# Patient Record
Sex: Male | Born: 1955
Health system: Southern US, Community
[De-identification: ages and names within clinical notes are randomized; demographics above are authoritative.]

## PROBLEM LIST (undated history)

## (undated) DIAGNOSIS — L989 Disorder of the skin and subcutaneous tissue, unspecified: Secondary | ICD-10-CM

## (undated) DIAGNOSIS — S2239XA Fracture of one rib, unspecified side, initial encounter for closed fracture: Secondary | ICD-10-CM

## (undated) DIAGNOSIS — J449 Chronic obstructive pulmonary disease, unspecified: Secondary | ICD-10-CM

## (undated) DIAGNOSIS — R0781 Pleurodynia: Secondary | ICD-10-CM

## (undated) DIAGNOSIS — J302 Other seasonal allergic rhinitis: Secondary | ICD-10-CM

## (undated) DIAGNOSIS — H6690 Otitis media, unspecified, unspecified ear: Secondary | ICD-10-CM

## (undated) DIAGNOSIS — J45909 Unspecified asthma, uncomplicated: Secondary | ICD-10-CM

## (undated) HISTORY — DX: Other seasonal allergic rhinitis: J30.2

## (undated) HISTORY — DX: Disorder of the skin and subcutaneous tissue, unspecified: L98.9

## (undated) HISTORY — DX: Pleurodynia: R07.81

## (undated) HISTORY — PX: HERNIA REPAIR: SHX51

## (undated) HISTORY — DX: Fracture of one rib, unspecified side, initial encounter for closed fracture: S22.39XA

## (undated) HISTORY — DX: Otitis media, unspecified, unspecified ear: H66.90

---

## 1998-02-23 HISTORY — PX: OTHER SURGICAL HISTORY: SHX169

## 2014-12-03 DIAGNOSIS — J449 Chronic obstructive pulmonary disease, unspecified: Secondary | ICD-10-CM | POA: Insufficient documentation

## 2014-12-03 DIAGNOSIS — F17210 Nicotine dependence, cigarettes, uncomplicated: Secondary | ICD-10-CM | POA: Insufficient documentation

## 2015-01-27 ENCOUNTER — Emergency Department (HOSPITAL_BASED_OUTPATIENT_CLINIC_OR_DEPARTMENT_OTHER): Payer: 59

## 2015-01-27 ENCOUNTER — Encounter (HOSPITAL_BASED_OUTPATIENT_CLINIC_OR_DEPARTMENT_OTHER): Payer: Self-pay | Admitting: Emergency Medicine

## 2015-01-27 ENCOUNTER — Emergency Department (HOSPITAL_BASED_OUTPATIENT_CLINIC_OR_DEPARTMENT_OTHER)
Admission: EM | Admit: 2015-01-27 | Discharge: 2015-01-28 | Disposition: A | Discharge status: Home / Self Care | Payer: 59 | Attending: Emergency Medicine | Admitting: Emergency Medicine

## 2015-01-27 DIAGNOSIS — J45909 Unspecified asthma, uncomplicated: Secondary | ICD-10-CM | POA: Diagnosis not present

## 2015-01-27 DIAGNOSIS — K858 Other acute pancreatitis without necrosis or infection: Secondary | ICD-10-CM

## 2015-01-27 DIAGNOSIS — Z9889 Other specified postprocedural states: Secondary | ICD-10-CM | POA: Diagnosis not present

## 2015-01-27 DIAGNOSIS — F17200 Nicotine dependence, unspecified, uncomplicated: Secondary | ICD-10-CM | POA: Insufficient documentation

## 2015-01-27 DIAGNOSIS — R1084 Generalized abdominal pain: Secondary | ICD-10-CM | POA: Diagnosis present

## 2015-01-27 HISTORY — DX: Unspecified asthma, uncomplicated: J45.909

## 2015-01-27 LAB — URINALYSIS, ROUTINE W REFLEX MICROSCOPIC
Bilirubin Urine: NEGATIVE
Glucose, UA: NEGATIVE mg/dL
HGB URINE DIPSTICK: NEGATIVE
KETONES UR: NEGATIVE mg/dL
LEUKOCYTES UA: NEGATIVE
Nitrite: NEGATIVE
PROTEIN: NEGATIVE mg/dL
Specific Gravity, Urine: 1.02 (ref 1.005–1.030)
pH: 6.5 (ref 5.0–8.0)

## 2015-01-27 LAB — COMPREHENSIVE METABOLIC PANEL
ALBUMIN: 4 g/dL (ref 3.5–5.0)
ALT: 24 U/L (ref 17–63)
AST: 33 U/L (ref 15–41)
Alkaline Phosphatase: 72 U/L (ref 38–126)
Anion gap: 9 (ref 5–15)
BUN: 10 mg/dL (ref 6–20)
CHLORIDE: 99 mmol/L — AB (ref 101–111)
CO2: 28 mmol/L (ref 22–32)
CREATININE: 0.71 mg/dL (ref 0.61–1.24)
Calcium: 9.1 mg/dL (ref 8.9–10.3)
GFR calc Af Amer: >60 mL/min (ref >60)
GFR calc non Af Amer: >60 mL/min (ref >60)
GLUCOSE: 135 mg/dL — AB (ref 65–99)
POTASSIUM: 3.8 mmol/L (ref 3.5–5.1)
SODIUM: 136 mmol/L (ref 135–145)
Total Bilirubin: 1.2 mg/dL (ref 0.3–1.2)
Total Protein: 6.6 g/dL (ref 6.5–8.1)

## 2015-01-27 LAB — CBC WITH DIFFERENTIAL/PLATELET
BASOS ABS: 0 10*3/uL (ref 0.0–0.1)
BASOS PCT: 0 %
EOS ABS: 0 10*3/uL (ref 0.0–0.7)
EOS PCT: 1 %
HCT: 48.5 % (ref 39.0–52.0)
Hemoglobin: 16.8 g/dL (ref 13.0–17.0)
LYMPHS PCT: 9 %
Lymphs Abs: 0.7 10*3/uL (ref 0.7–4.0)
MCH: 32.7 pg (ref 26.0–34.0)
MCHC: 34.6 g/dL (ref 30.0–36.0)
MCV: 94.5 fL (ref 78.0–100.0)
MONO ABS: 0.8 10*3/uL (ref 0.1–1.0)
Monocytes Relative: 9 %
Neutro Abs: 6.8 10*3/uL (ref 1.7–7.7)
Neutrophils Relative %: 81 %
PLATELETS: 198 10*3/uL (ref 150–400)
RBC: 5.13 MIL/uL (ref 4.22–5.81)
RDW: 12.9 % (ref 11.5–15.5)
WBC: 8.4 10*3/uL (ref 4.0–10.5)

## 2015-01-27 LAB — LIPASE, BLOOD: LIPASE: 388 U/L — AB (ref 11–51)

## 2015-01-27 MED ORDER — SUCRALFATE 1 G PO TABS
1.0000 g | ORAL_TABLET | Freq: Once | ORAL | Status: AC
  Administered 2015-01-27: 1 g via ORAL
  Filled 2015-01-27: qty 1

## 2015-01-27 MED ORDER — MORPHINE SULFATE (PF) 4 MG/ML IV SOLN
4.0000 mg | Freq: Once | INTRAVENOUS | Status: AC
  Administered 2015-01-27: 4 mg via INTRAVENOUS
  Filled 2015-01-27: qty 1

## 2015-01-27 MED ORDER — SODIUM CHLORIDE 0.9 % IV BOLUS (SEPSIS)
1000.0000 mL | Freq: Once | INTRAVENOUS | Status: AC
  Administered 2015-01-27: 1000 mL via INTRAVENOUS

## 2015-01-27 MED ORDER — HYDROMORPHONE HCL 1 MG/ML IJ SOLN
1.0000 mg | Freq: Once | INTRAMUSCULAR | Status: AC
  Administered 2015-01-27: 1 mg via INTRAVENOUS
  Filled 2015-01-27: qty 1

## 2015-01-27 MED ORDER — PANTOPRAZOLE SODIUM 40 MG IV SOLR
40.0000 mg | Freq: Once | INTRAVENOUS | Status: AC
  Administered 2015-01-27: 40 mg via INTRAVENOUS
  Filled 2015-01-27: qty 40

## 2015-01-27 MED ORDER — GI COCKTAIL ~~LOC~~
30.0000 mL | Freq: Once | ORAL | Status: AC
  Administered 2015-01-27: 30 mL via ORAL
  Filled 2015-01-27: qty 30

## 2015-01-27 MED ORDER — SODIUM CHLORIDE 0.9 % IV BOLUS (SEPSIS)
1000.0000 mL | Freq: Once | INTRAVENOUS | Status: AC
Start: 2015-01-27 — End: 2015-01-28
  Administered 2015-01-27: 1000 mL via INTRAVENOUS

## 2015-01-27 MED ORDER — ONDANSETRON HCL 4 MG/2ML IJ SOLN
4.0000 mg | Freq: Once | INTRAMUSCULAR | Status: AC
  Administered 2015-01-27: 4 mg via INTRAVENOUS
  Filled 2015-01-27: qty 2

## 2015-01-27 NOTE — ED Notes (Addendum)
Patient states that he ate quite a few oysters and drank some beers and now his Stomach is on "fire". Patient is guarding his stomach

## 2015-01-27 NOTE — ED Notes (Signed)
Pt states he drove to the ED but he will UBER home.

## 2015-01-27 NOTE — ED Provider Notes (Signed)
CSN: [id number]     Arrival date & time 01/27/15  2210 History  By signing my name below, I, Lucas Maldonado, attest that this documentation has been prepared under the direction and in the presence of Shon Baton, MD. Electronically Signed: Octavia Heir, ED Scribe. 01/27/2015. 11:34 PM.     Chief Complaint  Patient presents with  . Abdominal Pain      The history is provided by the patient. No language interpreter was used.   HPI Comments: [name] is a 59 y.o. male who presents to the Emergency Department complaining of constant, severe, gradual worsening, "10/10", generalized abdominal pain onset last night. He describes the pain as burning and sharp. The pain does not radiate. Pt reports eating a numerous amount of oysters and drank a few beer Saturday and notes his stomach is "on fire". He states he has not taken any medication for the pain but reports drinking plenty of water. Pt denies vomiting, diarrhea, fever, and sick contacts.  Past Medical History  Diagnosis Date  . Asthma    Past Surgical History  Procedure Laterality Date  . Hernia repair     History reviewed. No pertinent family history. Social History  Substance Use Topics  . Smoking status: Current Every Day Smoker  . Smokeless tobacco: None  . Alcohol Use: Yes     Comment: occ    Review of Systems  Constitutional: Negative for fever.  Respiratory: Negative for chest tightness and shortness of breath.   Cardiovascular: Negative for chest pain.  Gastrointestinal: Positive for nausea and abdominal pain. Negative for vomiting and diarrhea.  All other systems reviewed and are negative.     Allergies  Review of patient's allergies indicates no known allergies.  Home Medications   Prior to Admission medications   Medication Sig Start Date End Date Taking? Authorizing Provider  ondansetron (ZOFRAN ODT) 4 MG disintegrating tablet Take 1 tablet (4 mg total) by mouth every 8 (eight) hours as  needed for nausea or vomiting. 01/28/15   Shon Baton, MD  oxyCODONE-acetaminophen (PERCOCET/ROXICET) 5-325 MG tablet Take 1-2 tablets by mouth every 4 (four) hours as needed for severe pain. 01/28/15   Shon Baton, MD   Triage vitals: BP 170/95 mmHg  Pulse 76  Temp(Src) 98.3 F (36.8 C) (Oral)  Resp 22  Ht  (1.93 m)  Wt 190 lb (86.183 kg)  BMI 23.14 kg/m2  SpO2 95% Physical Exam  Constitutional: He is oriented to person, place, and time. He appears well-developed and well-nourished. No distress.  HENT:  Head: Normocephalic and atraumatic.  Cardiovascular: Normal rate, regular rhythm and normal heart sounds.   No murmur heard. Pulmonary/Chest: Effort normal and breath sounds normal. No respiratory distress. He has no wheezes.  Abdominal: Soft. Bowel sounds are normal. There is tenderness. There is no rebound and no guarding.  Mild epigastric tenderness to palpation without rebound or guarding  Musculoskeletal: He exhibits no edema.  Neurological: He is alert and oriented to person, place, and time.  Skin: Skin is warm and dry.  Psychiatric: He has a normal mood and affect.  Nursing note and vitals reviewed.   ED Course  Procedures  DIAGNOSTIC STUDIES: Oxygen Saturation is 95% on RA, adequate by my interpretation.  COORDINATION OF CARE:  11:33 PM Discussed treatment plan with pt at bedside and pt agreed to plan.  Labs Review Labs Reviewed  URINALYSIS, ROUTINE W REFLEX MICROSCOPIC (NOT AT [facility]) - Abnormal; Notable for the following:  Color, Urine AMBER (*)    All other components within normal limits  COMPREHENSIVE METABOLIC PANEL - Abnormal; Notable for the following:    Chloride 99 (*)    Glucose, Bld 135 (*)    All other components within normal limits  LIPASE, BLOOD - Abnormal; Notable for the following:    Lipase 388 (*)    All other components within normal limits  CBC WITH DIFFERENTIAL/PLATELET    Imaging Review Dg Abd Acute  W/chest  01/27/2015  CLINICAL DATA:  Acute onset of postprandial gastric discomfort. Initial encounter. EXAM: DG ABDOMEN ACUTE W/ 1V CHEST COMPARISON:  None. FINDINGS: The lungs are well-aerated and clear. There is no evidence of focal opacification, pleural effusion or pneumothorax. The cardiomediastinal silhouette is within normal limits. The visualized bowel gas pattern is unremarkable. Scattered fluid and air are seen within the small and large bowel, suggestive of mild dysmotility; there is no evidence of small bowel dilatation to suggest obstruction. No free intra-abdominal air is identified on the provided upright view. No acute osseous abnormalities are seen; the sacroiliac joints are unremarkable in appearance. There is mild developmental deformity involving the femoral necks bilaterally. IMPRESSION: 1. Scattered fluid and air noted within the visualized bowel, suggestive of mild dysmotility. No free intra-abdominal air seen. 2. No acute cardiopulmonary process seen. Electronically Signed   By: Roanna RaiderJeffery  Chang M.D.   On: 01/27/2015 23:47   I have personally reviewed and evaluated these images and lab results as part of my medical decision-making.   EKG Interpretation None      MDM   Final diagnoses:  Other acute pancreatitis   Patient presents with epigastric pain. Onset last night. Associated with increased alcohol intake.  Minimally tender on exam. Nontoxic and vital signs are reassuring. Patient was previously medically screened by my colleague and given pain medication. Still rating pain at 10 out of 10. Lab work is notable for a lipase of 388. Patient likely has mild pancreatitis associated with increased alcohol use.  Patient feels better after pain medication and fluids. He would like to try to go home. Given that his lipase is minimally elevated and he is comfortable, field is reasonable for him to go home. Discussed with patient limiting by mouth intake and focusing on hydration.  He'll be given pain medications. He was given strict return precautions.  After history, exam, and medical workup I feel the patient has been appropriately medically screened and is safe for discharge home. Pertinent diagnoses were discussed with the patient. Patient was given return precautions.  I personally performed the services described in this documentation, which was scribed in my presence. The recorded information has been reviewed and is accurate.   Shon Batonourtney F Jamieson Hetland, MD 01/28/15 [phone]

## 2015-01-28 MED ORDER — ONDANSETRON 4 MG PO TBDP: 4.0000 mg | ORAL_TABLET | Freq: Three times a day (TID) | ORAL | Status: DC | PRN

## 2015-01-28 MED ORDER — OXYCODONE-ACETAMINOPHEN 5-325 MG PO TABS
1.0000 | ORAL_TABLET | Freq: Once | ORAL | Status: AC
  Administered 2015-01-28: 1 via ORAL
  Filled 2015-01-28: qty 1

## 2015-01-28 MED ORDER — OXYCODONE-ACETAMINOPHEN 5-325 MG PO TABS: 1.0000 | ORAL_TABLET | ORAL | Status: DC | PRN

## 2015-01-28 NOTE — Discharge Instructions (Signed)
You were seen today and had evidence of pancreatitis. The treatment is pain control, hydration, and decreased by mouth intake for several days. If you develop worsening pain, fevers, difficulty tolerating fluids at home you need to be reevaluated immediately.  Acute Pancreatitis Acute pancreatitis is a disease in which the pancreas becomes suddenly inflamed. The pancreas is a large gland located behind your stomach. The pancreas produces enzymes that help digest food. The pancreas also releases the hormones glucagon and insulin that help regulate blood sugar. Damage to the pancreas occurs when the digestive enzymes from the pancreas are activated and begin attacking the pancreas before being released into the intestine. Most acute attacks last a couple of days and can cause serious complications. Some people become dehydrated and develop low blood pressure. In severe cases, bleeding into the pancreas can lead to shock and can be life-threatening. The lungs, heart, and kidneys may fail. CAUSES  Pancreatitis can happen to anyone. In some cases, the cause is unknown. Most cases are caused by:  Alcohol abuse.  Gallstones. Other less common causes are:  Certain medicines.  Exposure to certain chemicals.  Infection.  Damage caused by an accident (trauma).  Abdominal surgery. SYMPTOMS   Pain in the upper abdomen that may radiate to the back.  Tenderness and swelling of the abdomen.  Nausea and vomiting. DIAGNOSIS  Your caregiver will perform a physical exam. Blood and stool tests may be done to confirm the diagnosis. Imaging tests may also be done, such as X-rays, CT scans, or an ultrasound of the abdomen. TREATMENT  Treatment usually requires a stay in the hospital. Treatment may include:  Pain medicine.  Fluid replacement through an intravenous line (IV).  Placing a tube in the stomach to remove stomach contents and control vomiting.  Not eating for 3 or 4 days. This gives your  pancreas a rest, because enzymes are not being produced that can cause further damage.  Antibiotic medicines if your condition is caused by an infection.  Surgery of the pancreas or gallbladder. HOME CARE INSTRUCTIONS   Follow the diet advised by your caregiver. This may involve avoiding alcohol and decreasing the amount of fat in your diet.  Eat smaller, more frequent meals. This reduces the amount of digestive juices the pancreas produces.  Drink enough fluids to keep your urine clear or pale yellow.  Only take over-the-counter or prescription medicines as directed by your caregiver.  Avoid drinking alcohol if it caused your condition.  Do not smoke.  Get plenty of rest.  Check your blood sugar at home as directed by your caregiver.  Keep all follow-up appointments as directed by your caregiver. SEEK MEDICAL CARE IF:   You do not recover as quickly as expected.  You develop new or worsening symptoms.  You have persistent pain, weakness, or nausea.  You recover and then have another episode of pain. SEEK IMMEDIATE MEDICAL CARE IF:   You are unable to eat or keep fluids down.  Your pain becomes severe.  You have a fever or persistent symptoms for more than 2 to 3 days.  You have a fever and your symptoms suddenly get worse.  Your skin or the white part of your eyes turn yellow (jaundice).  You develop vomiting.  You feel dizzy, or you faint.  Your blood sugar is high (over 300 mg/dL). MAKE SURE YOU:   Understand these instructions.  Will watch your condition.  Will get help right away if you are not doing well or  get worse.   This information is not intended to replace advice given to you by your health care provider. Make sure you discuss any questions you have with your health care provider.   Document Released: 02/09/2005 Document Revised: 08/11/2011 Document Reviewed: 05/21/2011 Elsevier Interactive Patient Education [employer]

## 2015-04-26 ENCOUNTER — Encounter (HOSPITAL_BASED_OUTPATIENT_CLINIC_OR_DEPARTMENT_OTHER): Payer: Self-pay | Admitting: Emergency Medicine

## 2015-04-26 ENCOUNTER — Emergency Department (HOSPITAL_BASED_OUTPATIENT_CLINIC_OR_DEPARTMENT_OTHER): Payer: 59

## 2015-04-26 ENCOUNTER — Observation Stay (HOSPITAL_BASED_OUTPATIENT_CLINIC_OR_DEPARTMENT_OTHER)
Admission: EM | Admit: 2015-04-26 | Discharge: 2015-04-26 | Disposition: A | Discharge status: Home / Self Care | Payer: 59 | Attending: Cardiology | Admitting: Cardiology

## 2015-04-26 DIAGNOSIS — J449 Chronic obstructive pulmonary disease, unspecified: Secondary | ICD-10-CM | POA: Insufficient documentation

## 2015-04-26 DIAGNOSIS — J45909 Unspecified asthma, uncomplicated: Secondary | ICD-10-CM | POA: Insufficient documentation

## 2015-04-26 DIAGNOSIS — R9431 Abnormal electrocardiogram [ECG] [EKG]: Secondary | ICD-10-CM | POA: Insufficient documentation

## 2015-04-26 DIAGNOSIS — F17210 Nicotine dependence, cigarettes, uncomplicated: Secondary | ICD-10-CM | POA: Diagnosis not present

## 2015-04-26 DIAGNOSIS — I200 Unstable angina: Secondary | ICD-10-CM | POA: Diagnosis not present

## 2015-04-26 DIAGNOSIS — R079 Chest pain, unspecified: Principal | ICD-10-CM | POA: Insufficient documentation

## 2015-04-26 HISTORY — DX: Chronic obstructive pulmonary disease, unspecified: J44.9

## 2015-04-26 LAB — TROPONIN I: Troponin I: <0.03 ng/mL (ref <0.031)

## 2015-04-26 LAB — COMPREHENSIVE METABOLIC PANEL
ALBUMIN: 4.3 g/dL (ref 3.5–5.0)
ALK PHOS: 59 U/L (ref 38–126)
ALT: 19 U/L (ref 17–63)
AST: 23 U/L (ref 15–41)
Anion gap: 10 (ref 5–15)
BUN: 10 mg/dL (ref 6–20)
CALCIUM: 9.2 mg/dL (ref 8.9–10.3)
CHLORIDE: 104 mmol/L (ref 101–111)
CO2: 26 mmol/L (ref 22–32)
CREATININE: 0.69 mg/dL (ref 0.61–1.24)
GFR calc Af Amer: >60 mL/min (ref >60)
GFR calc non Af Amer: >60 mL/min (ref >60)
GLUCOSE: 113 mg/dL — AB (ref 65–99)
Potassium: 4.4 mmol/L (ref 3.5–5.1)
SODIUM: 140 mmol/L (ref 135–145)
Total Bilirubin: 0.5 mg/dL (ref 0.3–1.2)
Total Protein: 7.1 g/dL (ref 6.5–8.1)

## 2015-04-26 LAB — PROTIME-INR
INR: 0.97 (ref 0.00–1.49)
Prothrombin Time: 13.1 seconds (ref 11.6–15.2)

## 2015-04-26 LAB — LIPID PANEL
CHOL/HDL RATIO: 3.1 ratio
Cholesterol: 228 mg/dL — ABNORMAL HIGH (ref 0–200)
HDL: 73 mg/dL (ref >40)
LDL Cholesterol: 137 mg/dL — ABNORMAL HIGH (ref 0–99)
Triglycerides: 89 mg/dL (ref <150)
VLDL: 18 mg/dL (ref 0–40)

## 2015-04-26 LAB — CBC WITH DIFFERENTIAL/PLATELET
BASOS ABS: 0 10*3/uL (ref 0.0–0.1)
BASOS PCT: 1 %
EOS ABS: 0.5 10*3/uL (ref 0.0–0.7)
Eosinophils Relative: 6 %
HEMATOCRIT: 46.2 % (ref 39.0–52.0)
HEMOGLOBIN: 16 g/dL (ref 13.0–17.0)
Lymphocytes Relative: 22 %
Lymphs Abs: 1.6 10*3/uL (ref 0.7–4.0)
MCH: 33.2 pg (ref 26.0–34.0)
MCHC: 34.6 g/dL (ref 30.0–36.0)
MCV: 95.9 fL (ref 78.0–100.0)
MONOS PCT: 12 %
Monocytes Absolute: 0.9 10*3/uL (ref 0.1–1.0)
NEUTROS ABS: 4.3 10*3/uL (ref 1.7–7.7)
NEUTROS PCT: 59 %
Platelets: 261 10*3/uL (ref 150–400)
RBC: 4.82 MIL/uL (ref 4.22–5.81)
RDW: 14 % (ref 11.5–15.5)
WBC: 7.2 10*3/uL (ref 4.0–10.5)

## 2015-04-26 LAB — APTT: APTT: 30 s (ref 24–37)

## 2015-04-26 LAB — LIPASE, BLOOD: Lipase: 30 U/L (ref 11–51)

## 2015-04-26 MED ORDER — ASPIRIN 81 MG PO CHEW: 81.0000 mg | CHEWABLE_TABLET | Freq: Every day | ORAL | Status: DC

## 2015-04-26 MED ORDER — AZITHROMYCIN 250 MG PO TABS
500.0000 mg | ORAL_TABLET | Freq: Once | ORAL | Status: AC
  Administered 2015-04-26: 500 mg via ORAL
  Filled 2015-04-26: qty 2

## 2015-04-26 MED ORDER — NITROGLYCERIN IN D5W 200-5 MCG/ML-% IV SOLN
5.0000 ug/min | Freq: Once | INTRAVENOUS | Status: AC
  Administered 2015-04-26: 5 ug/min via INTRAVENOUS
  Filled 2015-04-26: qty 250

## 2015-04-26 MED ORDER — ACETAMINOPHEN 325 MG PO TABS
650.0000 mg | ORAL_TABLET | Freq: Once | ORAL | Status: AC
  Administered 2015-04-26: 650 mg via ORAL
  Filled 2015-04-26: qty 2

## 2015-04-26 MED ORDER — IPRATROPIUM-ALBUTEROL 0.5-2.5 (3) MG/3ML IN SOLN
3.0000 mL | Freq: Once | RESPIRATORY_TRACT | Status: AC
  Administered 2015-04-26: 3 mL via RESPIRATORY_TRACT
  Filled 2015-04-26: qty 3

## 2015-04-26 MED ORDER — ASPIRIN 81 MG PO CHEW
324.0000 mg | CHEWABLE_TABLET | Freq: Once | ORAL | Status: AC
  Administered 2015-04-26: 324 mg via ORAL
  Filled 2015-04-26: qty 4

## 2015-04-26 MED ORDER — IBUPROFEN 800 MG PO TABS
800.0000 mg | ORAL_TABLET | Freq: Once | ORAL | Status: AC
  Administered 2015-04-26: 800 mg via ORAL
  Filled 2015-04-26: qty 1

## 2015-04-26 MED ORDER — HEPARIN (PORCINE) IN NACL 100-0.45 UNIT/ML-% IJ SOLN
12.0000 [IU]/kg/h | INTRAMUSCULAR | Status: DC
  Administered 2015-04-26: 12 [IU]/kg/h via INTRAVENOUS
  Filled 2015-04-26: qty 250

## 2015-04-26 MED ORDER — AZITHROMYCIN 250 MG PO TABS: ORAL_TABLET | ORAL | Status: DC

## 2015-04-26 MED ORDER — ATORVASTATIN CALCIUM 20 MG PO TABS
20.0000 mg | ORAL_TABLET | Freq: Every day | ORAL | Status: DC
  Filled 2015-04-26: qty 1

## 2015-04-26 MED FILL — AZITHROMYCIN 250 MG TABLET: 250 | 4 days supply | Qty: 4 | Fill #0

## 2015-04-26 NOTE — ED Notes (Signed)
Pt reports left chest pressure non radiating with mild SOB onset this AM. Also concerned that it is related to skin infection he is being [facility] with keflex

## 2015-04-26 NOTE — ED Notes (Signed)
2nd EKG is the same as first EKG, pt's chest pain remains a 2/10 at this time. Pt came in originally for evaluation of ear pain with vein pain and rash to feet.  He noticed some left side chest pressure last night with SOB that was different than his typical COPD symptoms.

## 2015-04-26 NOTE — ED Notes (Addendum)
Pt placed on auto vitals Q30. Patient placed on cardiac monitor.  

## 2015-04-26 NOTE — ED Provider Notes (Signed)
10:31 AM Dr. Jacinto HalimGanji called this provider. Patient was initially admitted to the hospital by the overnight provider. Patient had chest pain on his left arm. Patient's EKG showed diffuse ST elevations. Repeat EKGs have been unchanged. We cycled 2 troponins which were negative. Dr. Jacinto HalimGanji initially thought that this was ischemic. However after talking to the patient on the phone, getting 2 negative troponins, negative chest x-ray, and EKGs that have not changed, Dr. Jacinto HalimGanji does not think that he needs a cardiac cath at this time. He will see patient in his office on Monday morning.  I discussed this plan with patient. We will get a second troponin at 1 PM. Patient still has "chest pain". This pain has not changed at all in the course of his stay here. In fact she states that this pain has been going on for a long period of time. It is not positional. It is not exertional. Not associated with diaphoresis. In talking to the patient he is not really concerned about his chest pain, he is most concerned that he "has an infection all over his body". Patient reports that since he moved to the area a year ago he's had cough congestion boils and rash. He is concerned that there is some infection.  Patient pain does not appear cardiac at this time. His EKGs although initially concerning has not had any changes. Dr. Jacinto HalimGanji thinks this is BER.  Patient has no history of hypertension or hyperlipidemia. His only risk factor is smoking.  Given that the expert, cardiologist, does not think that he will do any provocative testing there is little utility in admission to hospital at this time. We will repeat a third troponin. If continues to be normal this will be sufficient as an observation of patient and plan will be to discharge home with follow-up on Monday.  2:25 PM Troponin negative. Have observed patient for 9 hours, multiple cycled troponins, all negative with no ekg changes. Has been reviewed by cardiology and they would  like follow up on Monday or Tuesday. Pt feels comfortable with plan. Normal vitals, physical exam at time of discharge.   Faylinn Schwenn Randall AnLyn Kayal Mula, MD 04/26/15 1426

## 2015-04-26 NOTE — Discharge Instructions (Signed)
Your labs indicate that your chest pain is not due to your heart. We want you to follow up with the cardiologist within 48 hours- on Monday.  Dr. Jacinto Halim will see you.  In addition please take these antibiotics for bronchitis for your cough, we think it may help you feel better.   Please return with increased chest pain, sweating, shortness of breath or other concerns.   We want you to establish care with a PCP as well. We have given you phone number help you find one.   Acute Coronary Syndrome Acute coronary syndrome (ACS) is a serious problem in which there is suddenly not enough blood and oxygen supplied to the heart. ACS may mean that one or more of the blood vessels in your heart (coronary arteries) may be blocked. ACS can result in chest pain or a heart attack (myocardial infarction or MI). CAUSES This condition is caused by atherosclerosis, which is the buildup of fat and cholesterol (plaque) on the inside of the arteries. Over time, the plaque may narrow or block the artery, and this will lessen blood flow to the heart. Plaque can also become weak and break off within a coronary artery to form a clot and cause a sudden blockage. RISK FACTORS The risks factors of this condition include:  High cholesterol levels.  High blood pressure (hypertension).  Smoking.  Diabetes.  Age.  Family history of chest pain, heart disease, or stroke.  Lack of exercise. SYMPTOMS The most common signs of this condition include:  Chest pain, which can be:  A crushing or squeezing in the chest.  A tightness, pressure, fullness, or heaviness in the chest.  Present for more than a few minutes, or it can stop and recur.  Pain in the arms, neck, jaw, or back.  Unexplained heartburn or indigestion.  Shortness of breath.  Nausea.  Sudden cold sweats.  Feeling light-headed or dizzy. Sometimes, this condition has no symptoms. DIAGNOSIS ACS may be diagnosed through the following  tests:  Electrocardiogram (ECG).  Blood tests.  Coronary angiogram. This is a procedure to look at the coronary arteries to see if there is any blockage. TREATMENT Treatment for ACS may include:  Healthy behavioral changes to reduce or control risk factors.  Medicine.  Coronary stenting.A stent helps to keep an artery open.  Coronary angioplasty. This procedure widens a narrowed or blocked artery.  Coronary artery bypass surgery. This will allow your blood to pass the blockage (bypass) to reach your heart. HOME CARE INSTRUCTIONS Eating and Drinking  Follow a heart-healthy diet. A dietitian can you help to educate you about healthy food options and changes.  Use healthy cooking methods such as roasting, grilling, broiling, baking, poaching, steaming, or stir-frying. Talk to a dietitian to learn more about healthy cooking methods. Medicines  Take medicines only as directed by your health care provider.  Do not take the following medicines unless your health care provider approves:  Nonsteroidal anti-inflammatory drugs (NSAIDs), such as ibuprofen, naproxen, or celecoxib.  Vitamin supplements that contain vitamin A, vitamin E, or both.  Hormone replacement therapy that contains estrogen with or without progestin.  Stop illegal drug use. Activities  Follow an exercise program that is approved by your health care provider.  Plan rest periods when you are fatigued. Lifestyle  Do not use any tobacco products, including cigarettes, chewing tobacco, or electronic cigarettes. If you need help quitting, ask your health care provider.  If you drink alcohol, and your health care provider approves, limit  your alcohol intake to no more than 1 drink per day. One drink equals 12 ounces of beer, 5 ounces of wine, or 1 ounces of hard liquor.  Learn to manage stress.  Maintain a healthy weight. Lose weight as approved by your health care provider. General Instructions  Manage other  health conditions, such as hypertension and diabetes, as directed by your health care provider.  Keep all follow-up visits as directed by your health care provider. This is important.  Your health care provider may ask you to monitor your blood pressure. A blood pressure reading consists of a higher number over a lower number, such as 110 over 72, written as 110/72. Ideally, your blood pressure should be:  Below 140/90 if you have no other medical conditions.  Below 130/80 if you have diabetes or kidney disease. SEEK IMMEDIATE MEDICAL CARE IF:  You have pain in your chest, neck, arm, jaw, stomach, or back that lasts more than a few minutes, is recurring, or is not relieved by taking medicine under your tongue (sublingual nitroglycerin).  You have profuse sweating without cause.  You have unexplained:  Heartburn or indigestion.  Shortness of breath or difficulty breathing.  Nausea or vomiting.  Fatigue.  Feelings of nervousness or anxiety.  Weakness.  Diarrhea.  You have sudden light-headedness or dizziness.  You faint. These symptoms may represent a serious problem that is an emergency. Do not wait to see if the symptoms will go away. Get medical help right away. Call your local emergency services (911 in the [place] Do not drive yourself to the clinic or hospital.   This information is not intended to replace advice given to you by your health care provider. Make sure you discuss any questions you have with your health care provider.   Document Released: 02/09/2005 Document Revised: 03/02/2014 Document Reviewed: 06/13/2013 Elsevier Interactive Patient Education 2016 [employer]  Acute Coronary Syndrome Acute coronary syndrome (ACS) is a serious problem in which there is suddenly not enough blood and oxygen supplied to the heart. ACS may mean that one or more of the blood vessels in your heart (coronary arteries) may be blocked. ACS can result in chest pain or a heart  attack (myocardial infarction or MI). CAUSES This condition is caused by atherosclerosis, which is the buildup of fat and cholesterol (plaque) on the inside of the arteries. Over time, the plaque may narrow or block the artery, and this will lessen blood flow to the heart. Plaque can also become weak and break off within a coronary artery to form a clot and cause a sudden blockage. RISK FACTORS The risks factors of this condition include:  High cholesterol levels.  High blood pressure (hypertension).  Smoking.  Diabetes.  Age.  Family history of chest pain, heart disease, or stroke.  Lack of exercise. SYMPTOMS The most common signs of this condition include:  Chest pain, which can be:  A crushing or squeezing in the chest.  A tightness, pressure, fullness, or heaviness in the chest.  Present for more than a few minutes, or it can stop and recur.  Pain in the arms, neck, jaw, or back.  Unexplained heartburn or indigestion.  Shortness of breath.  Nausea.  Sudden cold sweats.  Feeling light-headed or dizzy. Sometimes, this condition has no symptoms. DIAGNOSIS ACS may be diagnosed through the following tests:  Electrocardiogram (ECG).  Blood tests.  Coronary angiogram. This is a procedure to look at the coronary arteries to see if there is any blockage.  TREATMENT Treatment for ACS may include:  Healthy behavioral changes to reduce or control risk factors.  Medicine.  Coronary stenting.A stent helps to keep an artery open.  Coronary angioplasty. This procedure widens a narrowed or blocked artery.  Coronary artery bypass surgery. This will allow your blood to pass the blockage (bypass) to reach your heart. HOME CARE INSTRUCTIONS Eating and Drinking  Follow a heart-healthy diet. A dietitian can you help to educate you about healthy food options and changes.  Use healthy cooking methods such as roasting, grilling, broiling, baking, poaching, steaming, or  stir-frying. Talk to a dietitian to learn more about healthy cooking methods. Medicines  Take medicines only as directed by your health care provider.  Do not take the following medicines unless your health care provider approves:  Nonsteroidal anti-inflammatory drugs (NSAIDs), such as ibuprofen, naproxen, or celecoxib.  Vitamin supplements that contain vitamin A, vitamin E, or both.  Hormone replacement therapy that contains estrogen with or without progestin.  Stop illegal drug use. Activities  Follow an exercise program that is approved by your health care provider.  Plan rest periods when you are fatigued. Lifestyle  Do not use any tobacco products, including cigarettes, chewing tobacco, or electronic cigarettes. If you need help quitting, ask your health care provider.  If you drink alcohol, and your health care provider approves, limit your alcohol intake to no more than 1 drink per day. One drink equals 12 ounces of beer, 5 ounces of wine, or 1 ounces of hard liquor.  Learn to manage stress.  Maintain a healthy weight. Lose weight as approved by your health care provider. General Instructions  Manage other health conditions, such as hypertension and diabetes, as directed by your health care provider.  Keep all follow-up visits as directed by your health care provider. This is important.  Your health care provider may ask you to monitor your blood pressure. A blood pressure reading consists of a higher number over a lower number, such as 110 over 72, written as 110/72. Ideally, your blood pressure should be:  Below 140/90 if you have no other medical conditions.  Below 130/80 if you have diabetes or kidney disease. SEEK IMMEDIATE MEDICAL CARE IF:  You have pain in your chest, neck, arm, jaw, stomach, or back that lasts more than a few minutes, is recurring, or is not relieved by taking medicine under your tongue (sublingual nitroglycerin).  You have profuse sweating  without cause.  You have unexplained:  Heartburn or indigestion.  Shortness of breath or difficulty breathing.  Nausea or vomiting.  Fatigue.  Feelings of nervousness or anxiety.  Weakness.  Diarrhea.  You have sudden light-headedness or dizziness.  You faint. These symptoms may represent a serious problem that is an emergency. Do not wait to see if the symptoms will go away. Get medical help right away. Call your local emergency services (911 in the [place] Do not drive yourself to the clinic or hospital.   This information is not intended to replace advice given to you by your health care provider. Make sure you discuss any questions you have with your health care provider.   Document Released: 02/09/2005 Document Revised: 03/02/2014 Document Reviewed: 06/13/2013 Elsevier Interactive Patient Education 2016 Elsevier Inc. Angina Pectoris Angina pectoris, often called angina, is extreme discomfort in the chest, neck, or arm. This is caused by a lack of blood in the middle and thickest layer of the heart wall (myocardium). There are four types of angina:  Stable angina. Stable  angina usually occurs in episodes of predictable frequency and duration. It is usually brought on by physical activity, stress, or excitement. Stable angina usually lasts a few minutes and can often be relieved by a medicine that you place under your tongue. This medicine is called sublingual nitroglycerin.  Unstable angina. Unstable angina can occur even when you are doing little or no physical activity. It can even occur while you are sleeping or when you are at rest. It can suddenly increase in severity or frequency. It may not be relieved by sublingual nitroglycerin, and it can last up to 30 minutes.  Microvascular angina. This type of angina is caused by a disorder of tiny blood vessels called arterioles. Microvascular angina is more common in women. The pain may be more severe and last longer than other  types of angina pectoris.  Prinzmetal or variant angina. This type of angina pectoris is rare and usually occurs when you are doing little or no physical activity. It especially occurs in the early morning hours. CAUSES Atherosclerosis is the cause of angina. This is the buildup of fat and cholesterol (plaque) on the inside of the arteries. Over time, the plaque may narrow or block the artery, and this will lessen blood flow to the heart. Plaque can also become weak and break off within a coronary artery to form a clot and cause a sudden blockage. RISK FACTORS Risk factors common to both men and women include:  High cholesterol levels.  High blood pressure (hypertension).  Tobacco use.  Diabetes.  Family history of angina.  Obesity.  Lack of exercise.  A diet high in saturated fats. Women are at greater risk for angina if they are:  Over age 39.  Postmenopausal. SYMPTOMS Many people do not experience any symptoms during the early stages of angina. As the condition progresses, symptoms common to both men and women may include:  Chest pain.  The pain can be described as a crushing or squeezing in the chest, or a tightness, pressure, fullness, or heaviness in the chest.  The pain can last more than a few minutes, or it can stop and recur.  Pain in the arms, neck, jaw, or back.  Unexplained heartburn or indigestion.  Shortness of breath.  Nausea.  Sudden cold sweats.  Sudden light-headedness. Many women have chest discomfort and some of the other symptoms. However, women often have different (atypical) symptoms, such as:   Fatigue.  Unexplained feelings of nervousness or anxiety.  Unexplained weakness.  Dizziness or fainting. Sometimes, women may have angina without any symptoms. DIAGNOSIS  Tests to diagnose angina may include:  ECG (electrocardiogram).  Exercise stress test. This looks for signs of blockage when the heart is being  exercised.  Pharmacologic stress test. This test looks for signs of blockage when the heart is being stressed with a medicine.  Blood tests.  Coronary angiogram. This is a procedure to look at the coronary arteries to see if there is any blockage. TREATMENT  The treatment of angina may include the following:  Healthy behavioral changes to reduce or control risk factors.  Medicine.  Coronary stenting.A stent helps to keep an artery open.  Coronary angioplasty. This procedure widens a narrowed or blocked artery.  Coronary arterybypass surgery. This will allow your blood to pass the blockage (bypass) to reach your heart. HOME CARE INSTRUCTIONS   Take medicines only as directed by your health care provider.  Do not take the following medicines unless your health care provider approves:  Nonsteroidal anti-inflammatory drugs (NSAIDs), such as ibuprofen, naproxen, or celecoxib.  Vitamin supplements that contain vitamin A, vitamin E, or both.  Hormone replacement therapy that contains estrogen with or without progestin.  Manage other health conditions such as hypertension and diabetes as directed by your health care provider.  Follow a heart-healthy diet. A dietitian can help to educate you about healthy food options and changes.  Use healthy cooking methods such as roasting, grilling, broiling, baking, poaching, steaming, or stir-frying. Talk to a dietitian to learn more about healthy cooking methods.  Follow an exercise program approved by your health care provider.  Maintain a healthy weight. Lose weight as approved by your health care provider.  Plan rest periods when fatigued.  Learn to manage stress.  Do not use any tobacco products, including cigarettes, chewing tobacco, or electronic cigarettes. If you need help quitting, ask your health care provider.  If you drink alcohol, and your health care provider approves, limit your alcohol intake to no more than 1 drink per  day. One drink equals 12 ounces of beer, 5 ounces of wine, or 1 ounces of hard liquor.  Stop illegal drug use.  Keep all follow-up visits as directed by your health care provider. This is important. SEEK IMMEDIATE MEDICAL CARE IF:   You have pain in your chest, neck, arm, jaw, stomach, or back that lasts more than a few minutes, is recurring, or is unrelieved by taking sublingualnitroglycerin.  You have profuse sweating without cause.  You have unexplained:  Heartburn or indigestion.  Shortness of breath or difficulty breathing.  Nausea or vomiting.  Fatigue.  Feelings of nervousness or anxiety.  Weakness.  Diarrhea.  You have sudden light-headedness or dizziness.  You faint. These symptoms may represent a serious problem that is an emergency. Do not wait to see if the symptoms will go away. Get medical help right away. Call your local emergency services (911 in the [place] Do not drive yourself to the hospital.   This information is not intended to replace advice given to you by your health care provider. Make sure you discuss any questions you have with your health care provider.   Document Released: 02/09/2005 Document Revised: 03/02/2014 Document Reviewed: 06/13/2013 Elsevier Interactive Patient Education [employer] Else[employer] Inc.

## 2015-04-26 NOTE — Progress Notes (Signed)
Patient initially admitted with arm discomfort suggestive of musculoskeletal etiology, however due to abnormal EKG was then decided to be admitted for possible abnormal EKG, EKG unchanged and suggests early repolarization. I do not suspect acute pericarditis as patient does not have any chest pain, troponins are negative, no fever, no cardiomegaly and chest x-ray, hence overall I feel patient is low risk for ACS but overall high risk with regard to long-term CAD risk especially in view of heavy smoking. I will be happy to follow him up in the outpatient basis. Agree with repeating another troponin and if negative can be discharged home.  Discharge on aspirin 81 mg daily. Atorvastatin 20 mg daily.

## 2015-04-26 NOTE — ED Provider Notes (Signed)
CSN: [id number]     Arrival date & time 04/26/15  0545 History   First MD Initiated Contact with Patient 04/26/15 [phone]     Chief Complaint  Patient presents with  . Chest Pain     (Consider location/radiation/quality/duration/timing/severity/associated sxs/prior Treatment) HPI Comments: The patient is a 60 year old male, he does have a history of COPD, he has recently struggled with several conditions including chronic skin infections of his left foot as well as drainage and discomfort from his left ear however this morning he awoke with some left-sided chest pressure and some shortness of breath which has been intermittent lately. He does report having a stress test several years ago though he cannot remember exactly what it was, he has never had a heart catheterization, he has never been told that he had obstructive disease. He reports that he does not exercise exclusively but does get a lot of exercise at work walking long distances, he never has exertional chest pain or shortness of breath. He continues to smoke cigarettes.  Patient is a 60 y.o. male presenting with chest pain. The history is provided by the patient.  Chest Pain   Past Medical History  Diagnosis Date  . Asthma   . COPD (chronic obstructive pulmonary disease) [facility](HCC)    Past Surgical History  Procedure Laterality Date  . Hernia repair    . Left arm fx with surgical repair  2000   History reviewed. No pertinent family history. Social History  Substance Use Topics  . Smoking status: Heavy Tobacco Smoker -- 1.00 packs/day    Types: Cigarettes  . Smokeless tobacco: Never Used  . Alcohol Use: Yes     Comment: occ    Review of Systems  Cardiovascular: Positive for chest pain.  All other systems reviewed and are negative.     Allergies  Review of patient's allergies indicates no known allergies.  Home Medications   Prior to Admission medications   Medication Sig Start Date End Date Taking? Authorizing  Provider  ondansetron (ZOFRAN ODT) 4 MG disintegrating tablet Take 1 tablet (4 mg total) by mouth every 8 (eight) hours as needed for nausea or vomiting. 01/28/15   Shon Batonourtney F Horton, MD  oxyCODONE-acetaminophen (PERCOCET/ROXICET) 5-325 MG tablet Take 1-2 tablets by mouth every 4 (four) hours as needed for severe pain. 01/28/15   Shon Batonourtney F Horton, MD   BP 140/89 mmHg  Pulse 86  Temp(Src) 98 F (36.7 C) (Oral)  Ht 6\' 4"  (1.93 m)  Wt 190 lb (86.183 kg)  BMI 23.14 kg/m2  SpO2 96% Physical Exam  Constitutional: He appears well-developed and well-nourished. No distress.  HENT:  Head: Normocephalic and atraumatic.  Mouth/Throat: Oropharynx is clear and moist. No oropharyngeal exudate.  Left tympanic membrane is visualized, there is some erythema, some sclerosis, some purulent drainage in the external auditory canal, no tenderness with manipulation of the charcoal or tragus, right tympanic membranes visualized and normal  Eyes: Conjunctivae and EOM are normal. Pupils are equal, round, and reactive to light. Right eye exhibits no discharge. Left eye exhibits no discharge. No scleral icterus.  Neck: Normal range of motion. Neck supple. No JVD present. No thyromegaly present.  Supple neck, no lymphadenopathy  Cardiovascular: Normal rate, regular rhythm, normal heart sounds and intact distal pulses.  Exam reveals no gallop and no friction rub.   No murmur heard. No murmurs rubs or gallops, strong pulses at the radial arteries, no JVD, no peripheral edema  Pulmonary/Chest: Effort normal and breath sounds normal. No  respiratory distress. He has no wheezes. He has no rales.  Clear lung sounds, no wheezing rhonchi or rales, no distress, speaks in full sentences  Abdominal: Soft. Bowel sounds are normal. He exhibits no distension and no mass. There is no tenderness.  Soft nontender abdomen  Musculoskeletal: Normal range of motion. He exhibits no edema or tenderness.  Lymphadenopathy:    He has no  cervical adenopathy.  Neurological: He is alert. Coordination normal.  Skin: Skin is warm and dry. Rash (slight erythematous rash between the toes of the left foot, no weeping, no purulent drainage, no swelling) noted. No erythema.  Psychiatric: He has a normal mood and affect. His behavior is normal.  Nursing note and vitals reviewed.   ED Course  Procedures (including critical care time) Labs Review Labs Reviewed  CBC WITH DIFFERENTIAL/PLATELET  COMPREHENSIVE METABOLIC PANEL  LIPASE, BLOOD  TROPONIN I  LIPID PANEL    Imaging Review No results found. I have personally reviewed and evaluated these images and lab results as part of my medical decision-making.   EKG Interpretation   Date/Time:  Friday April 26 2015 06:04:06 EST Ventricular Rate:  81 PR Interval:  155 QRS Duration: 93 QT Interval:  365 QTC Calculation: 424 R Axis:   81 Text Interpretation:  Sinus rhythm ST elevation, consider inferior injury  no reciprocal changes No old tracing to compare Confirmed by Alisi Lupien  MD,  Shaylen Nephew ([id number]) on 04/26/2015 6:19:39 AM      MDM   Final diagnoses:  Unstable angina (HCC)    The patient has an abnormal EKG, there is no old EKG with which to compare. The EKG does show ST elevation specifically in lead V3, there is mild diffuse ST elevation and no reciprocal changes. I will discuss this with the cardiologist, possible pericarditis though he has no positional change in symptoms and with his recent onset of shortness of breath he may warrant admission with rule out for obstructive disease.  6:30 AM, discussed care with the cardiologist Dr. Mikel Cella who has accepted the patient in transfer, plans on further heart investigation today. He is agreeable that the EKG is abnormal but not a STEMI.  Start heparin and nitro gtt  Unstable angina  Medications  nitroGLYCERIN 50 mg in dextrose 5 % 250 mL (0.2 mg/mL) infusion (not administered)  heparin ADULT infusion 100 units/mL (25000  units/250 mL) (not administered)  aspirin chewable tablet 324 mg (324 mg Oral Given 04/26/15 6045)     CRITICAL CARE Performed by: Eber Hong D Total critical care time: 35 minutes Critical care time was exclusive of separately billable procedures and treating other patients. Critical care was necessary to treat or prevent imminent or life-threatening deterioration. Critical care was time spent personally by me on the following activities: development of treatment plan with patient and/or surrogate as well as nursing, discussions with consultants, evaluation of patient's response to treatment, examination of patient, obtaining history from patient or surrogate, ordering and performing treatments and interventions, ordering and review of laboratory studies, ordering and review of radiographic studies, pulse oximetry and re-evaluation of patient's condition.   Eber Hong, MD 04/26/15 [phone]

## 2015-05-13 ENCOUNTER — Ambulatory Visit (INDEPENDENT_AMBULATORY_CARE_PROVIDER_SITE_OTHER): Payer: 59 | Admitting: Infectious Disease

## 2015-05-13 ENCOUNTER — Encounter: Payer: Self-pay | Admitting: Infectious Disease

## 2015-05-13 ENCOUNTER — Ambulatory Visit
Admission: RE | Admit: 2015-05-13 | Discharge: 2015-05-13 | Disposition: A | Discharge status: Home / Self Care | Payer: 59 | Source: Ambulatory Visit | Attending: Infectious Disease | Admitting: Infectious Disease

## 2015-05-13 VITALS — BP 155/85 | HR 94 | Temp 98.2°F | Ht 76.0 in | Wt 200.0 lb

## 2015-05-13 DIAGNOSIS — H65116 Acute and subacute allergic otitis media (mucoid) (sanguinous) (serous), recurrent, bilateral: Secondary | ICD-10-CM

## 2015-05-13 DIAGNOSIS — R0781 Pleurodynia: Secondary | ICD-10-CM | POA: Diagnosis not present

## 2015-05-13 DIAGNOSIS — L989 Disorder of the skin and subcutaneous tissue, unspecified: Secondary | ICD-10-CM | POA: Diagnosis not present

## 2015-05-13 DIAGNOSIS — J418 Mixed simple and mucopurulent chronic bronchitis: Secondary | ICD-10-CM

## 2015-05-13 DIAGNOSIS — Z720 Tobacco use: Secondary | ICD-10-CM | POA: Diagnosis not present

## 2015-05-13 DIAGNOSIS — F17210 Nicotine dependence, cigarettes, uncomplicated: Secondary | ICD-10-CM

## 2015-05-13 DIAGNOSIS — H6690 Otitis media, unspecified, unspecified ear: Secondary | ICD-10-CM

## 2015-05-13 DIAGNOSIS — J302 Other seasonal allergic rhinitis: Secondary | ICD-10-CM

## 2015-05-13 HISTORY — DX: Pleurodynia: R07.81

## 2015-05-13 HISTORY — DX: Disorder of the skin and subcutaneous tissue, unspecified: L98.9

## 2015-05-13 HISTORY — DX: Otitis media, unspecified, unspecified ear: H66.90

## 2015-05-13 HISTORY — DX: Other seasonal allergic rhinitis: J30.2

## 2015-05-13 LAB — CBC WITH DIFFERENTIAL/PLATELET
BASOS PCT: 0 % (ref 0–1)
Basophils Absolute: 0 10*3/uL (ref 0.0–0.1)
EOS PCT: 3 % (ref 0–5)
Eosinophils Absolute: 0.3 10*3/uL (ref 0.0–0.7)
HCT: 43.9 % (ref 39.0–52.0)
Hemoglobin: 14.6 g/dL (ref 13.0–17.0)
Lymphocytes Relative: 14 % (ref 12–46)
Lymphs Abs: 1.3 10*3/uL (ref 0.7–4.0)
MCH: 32.4 pg (ref 26.0–34.0)
MCHC: 33.3 g/dL (ref 30.0–36.0)
MCV: 97.3 fL (ref 78.0–100.0)
MONO ABS: 0.8 10*3/uL (ref 0.1–1.0)
MPV: 10.1 fL (ref 8.6–12.4)
Monocytes Relative: 9 % (ref 3–12)
NEUTROS ABS: 6.7 10*3/uL (ref 1.7–7.7)
Neutrophils Relative %: 74 % (ref 43–77)
PLATELETS: 270 10*3/uL (ref 150–400)
RBC: 4.51 MIL/uL (ref 4.22–5.81)
RDW: 13.4 % (ref 11.5–15.5)
WBC: 9.1 10*3/uL (ref 4.0–10.5)

## 2015-05-13 LAB — COMPLETE METABOLIC PANEL WITH GFR
ALT: 17 U/L (ref 9–46)
AST: 23 U/L (ref 10–35)
Albumin: 4.4 g/dL (ref 3.6–5.1)
Alkaline Phosphatase: 66 U/L (ref 40–115)
BUN: 16 mg/dL (ref 7–25)
CHLORIDE: 102 mmol/L (ref 98–110)
CO2: 25 mmol/L (ref 20–31)
Calcium: 9.5 mg/dL (ref 8.6–10.3)
Creat: 0.78 mg/dL (ref 0.70–1.33)
GFR, Est African American: >89 mL/min (ref >=60)
GFR, Est Non African American: >89 mL/min (ref >=60)
GLUCOSE: 87 mg/dL (ref 65–99)
POTASSIUM: 4.2 mmol/L (ref 3.5–5.3)
SODIUM: 139 mmol/L (ref 135–146)
Total Bilirubin: 0.4 mg/dL (ref 0.2–1.2)
Total Protein: 6.8 g/dL (ref 6.1–8.1)

## 2015-05-13 LAB — HIV ANTIBODY (ROUTINE TESTING W REFLEX): HIV 1&2 Ab, 4th Generation: NONREACTIVE

## 2015-05-13 LAB — C-REACTIVE PROTEIN: CRP: 1 mg/dL — ABNORMAL HIGH (ref <0.60)

## 2015-05-13 LAB — HEMOGLOBIN A1C
Hgb A1c MFr Bld: 5.9 % — ABNORMAL HIGH (ref <5.7)
MEAN PLASMA GLUCOSE: 123 mg/dL — AB (ref <117)

## 2015-05-13 LAB — RHEUMATOID FACTOR

## 2015-05-13 MED ORDER — CEPHALEXIN 500 MG PO CAPS: 500.0000 mg | ORAL_CAPSULE | Freq: Four times a day (QID) | ORAL | Status: DC

## 2015-05-13 NOTE — Progress Notes (Signed)
Reason for Consult: recurrent infection of bilateral ears and feet  Requesting Physician: Dr. Einar Gip  Subjective:    Patient ID: Lucas Maldonado, male    DOB: May 12, 1955, 60 y.o.   MRN: [id number]  HPI  60 year old man with hx of smoking COPD who moved from [place] to [place] in January. He began experiencing whtt he thought at first was allergic sinusitis and also pruritis of bilateral feet. He was seen by Kathreen Devoid MD with [facility] who gave him iniitial rx for prednisone. He in the interim had developed reported purulent otitis media bilaterally. Cultures were sent from right ear canal which grew MSSA. He was placed on augmentin and prednisone and referred to an allergist who reportedly deduced that patient "did not have allergies to anything."  Note I do  Not have records of the patients testing for allergens and do not know if he was on systemic steroids while such tests were done.  Regardless he had some transient reduction in symptoms of ear fullness and drainage. His foot lesions improved slightly but persisted.   He was given keflex which he completed and had similar efficacy. He had some chest discomfort and was sent to Dr. Einar Gip who referred him to [place] for workup for what was thought to be recurrent otitis media foot lesions  He is still c/o drainage of the right ear. He has lesions on his feet that are pruritic and at times drain bloody and clear and other times purulent material. They are on his plantar aspects between toes and along sides and top of the feet. Not known to be a diabetic. No AI hx.  He recently slipped in the shower in past few days and believes he may have fractured a rib on the right side.   Past Medical History  Diagnosis Date  . Asthma   . COPD (chronic obstructive pulmonary disease) ([place])   . Recurrent otitis media 05/13/2015  . Foot lesion 05/13/2015  . Rib pain 05/13/2015    Past Surgical History  Procedure Laterality Date  . Hernia  repair    . Left arm fx with surgical repair  2000    No family history on file.    Social History   Social History  . Marital Status: Single    Spouse Name: N/A  . Number of Children: N/A  . Years of Education: N/A   Social History Main Topics  . Smoking status: Heavy Tobacco Smoker -- 1.50 packs/day    Types: Cigarettes    Start date: 02/23/1977  . Smokeless tobacco: Never Used     Comment: wants to quit smoking  . Alcohol Use: 0.0 oz/week    0 Standard drinks or equivalent per week     Comment: socially  . Drug Use: No  . Sexual Activity: Not Asked   Other Topics Concern  . None   Social History Narrative    No Known Allergies   Current outpatient prescriptions:  .  aspirin 81 MG tablet, Take 81 mg by mouth daily., Disp: , Rfl:  .  umeclidinium-vilanterol (ANORO ELLIPTA) 62.5-25 MCG/INH AEPB, Inhale 1 puff into the lungs daily., Disp: , Rfl:  .  cephALEXin (KEFLEX) 500 MG capsule, Take 1 capsule (500 mg total) by mouth 4 (four) times daily., Disp: 56 capsule, Rfl: 2   Review of Systems  Constitutional: Negative for fever, chills, diaphoresis, activity change, appetite change, fatigue and unexpected weight change.  HENT: Positive for congestion and ear discharge. Negative  for rhinorrhea, sinus pressure, sneezing, sore throat and trouble swallowing.   Eyes: Negative for photophobia and visual disturbance.  Respiratory: Negative for cough, chest tightness, shortness of breath, wheezing and stridor.   Cardiovascular: Negative for chest pain, palpitations and leg swelling.  Gastrointestinal: Negative for nausea, vomiting, abdominal pain, diarrhea, constipation, blood in stool, abdominal distention and anal bleeding.  Genitourinary: Negative for dysuria, hematuria, flank pain and difficulty urinating.  Musculoskeletal: Positive for arthralgias. Negative for myalgias, back pain, joint swelling and gait problem.  Skin: Positive for rash. Negative for color change, pallor  and wound.  Neurological: Negative for dizziness, tremors, weakness and light-headedness.  Hematological: Negative for adenopathy. Does not bruise/bleed easily.  Psychiatric/Behavioral: Negative for behavioral problems, confusion, sleep disturbance, dysphoric mood, decreased concentration and agitation.       Objective:   Physical Exam  Constitutional: He is oriented to person, place, and time. He appears well-developed and well-nourished.  HENT:  Head: Normocephalic and atraumatic.  Right Ear: Hearing normal.  Left Ear: Hearing normal.  Copious wax in bilateral ears  Eyes: Conjunctivae and EOM are normal.  Neck: Normal range of motion. Neck supple.  Cardiovascular: Normal rate, regular rhythm and normal heart sounds.  Exam reveals no gallop and no friction rub.   No murmur heard. Pulmonary/Chest: Effort normal. No respiratory distress. He has no wheezes.  Abdominal: Soft. He exhibits no distension.  Musculoskeletal: Normal range of motion. He exhibits no edema or tenderness.  Neurological: He is alert and oriented to person, place, and time.  Skin: Skin is warm and dry. Rash noted. No erythema. No pallor.  Psychiatric: He has a normal mood and affect. His behavior is normal. Judgment and thought content normal.   He has lesions that are largely circular and on dorsum and plantar aspect callused in places likely excoriated, also between toes I attempted to take pictures but only one took  05/13/15:           Assessment & Plan:   Recurrent ear "infections: and lesion on his feet: I find his symptoms MUCH more compelling for an allergic reaction or manifestation of auto-immune pathology. This symmetric pathology in only the ears and feet bilaterally does not make much sense for infection. I think it is more likely allergic reaction particularly since these came on since he moved to [place] from [place] and in the "spring time weather of late January, February"  I will order some  Auto-immune labs including ESR, CRP, ANCA's ACEI, CXR, CMP cbc c ciff  I will check HIV and HCV ab  I will give him a 2 week course of keflex  I have instructed him to take zyrtec daily  I will consider sending him to Dermatologist for biopsy of foot lesions and evaluation and treatment  Rib pain: He had slipped and had mechanical fall and I obtained plain films that show rib fracture. Will let his PCP know  CXR with atherosclerosis of the aorta, defer to PCP and  Cardiology  COPD: being followed by Pulmonary.  I spent greater than 60 minutes with the patient including greater than 50% of time in face to face counsel of the patient  Re his recurrent ear and foot pathology likely due to allergic reactions rather than infection, AI workup, rib pain and in coordination of his care.

## 2015-05-14 ENCOUNTER — Telehealth: Payer: Self-pay | Admitting: *Deleted

## 2015-05-14 LAB — SEDIMENTATION RATE: SED RATE: 4 mm/h (ref 0–20)

## 2015-05-14 LAB — ANCA SCREEN W REFLEX TITER: ANCA Screen: NEGATIVE

## 2015-05-14 LAB — ANGIOTENSIN CONVERTING ENZYME: Angiotensin-Converting Enzyme: 30 U/L (ref 8–52)

## 2015-05-14 NOTE — Telephone Encounter (Signed)
Patient called and asked if the doctor could give him something for pain for his cracked ribs. Advised will ask and give him a call back once I ask.

## 2015-05-14 NOTE — Telephone Encounter (Signed)
Lucas Maldonado I would prefer that he have such meds prescribed by his PCP. He should try there first

## 2015-05-15 NOTE — Telephone Encounter (Signed)
Called the patient to ask how he was and check if he has a PCP and he advised yes and he was able to get pain meds from them yesterday.

## 2015-06-17 ENCOUNTER — Encounter: Payer: Self-pay | Admitting: Infectious Disease

## 2015-06-17 ENCOUNTER — Ambulatory Visit (INDEPENDENT_AMBULATORY_CARE_PROVIDER_SITE_OTHER): Payer: 59 | Admitting: Infectious Disease

## 2015-06-17 VITALS — BP 143/94 | HR 82 | Temp 98.0°F | Ht 76.0 in | Wt 197.0 lb

## 2015-06-17 DIAGNOSIS — H65191 Other acute nonsuppurative otitis media, right ear: Secondary | ICD-10-CM

## 2015-06-17 DIAGNOSIS — J418 Mixed simple and mucopurulent chronic bronchitis: Secondary | ICD-10-CM | POA: Diagnosis not present

## 2015-06-17 DIAGNOSIS — S2239XA Fracture of one rib, unspecified side, initial encounter for closed fracture: Secondary | ICD-10-CM

## 2015-06-17 DIAGNOSIS — J302 Other seasonal allergic rhinitis: Secondary | ICD-10-CM

## 2015-06-17 DIAGNOSIS — S2231XG Fracture of one rib, right side, subsequent encounter for fracture with delayed healing: Secondary | ICD-10-CM

## 2015-06-17 DIAGNOSIS — L989 Disorder of the skin and subcutaneous tissue, unspecified: Secondary | ICD-10-CM | POA: Diagnosis not present

## 2015-06-17 HISTORY — DX: Fracture of one rib, unspecified side, initial encounter for closed fracture: S22.39XA

## 2015-06-17 MED ORDER — CIPROFLOXACIN-DEXAMETHASONE 0.3-0.1 % OT SUSP: 4.0000 [drp] | Freq: Two times a day (BID) | OTIC | Status: DC

## 2015-06-17 NOTE — Progress Notes (Signed)
Chief complaint: left ear drainage    Subjective:    Patient ID: Lucas Maldonado, male    DOB: Jul 07, 1955, 60 y.o.   MRN: [id number]  HPI   60 year old man with hx of smoking COPD who moved from [place] to [place] in January. He began experiencing whatt he thought at first was allergic sinusitis and also pruritis of bilateral feet. He was seen by Justice Britain MD with [facility] who gave him iniitial rx for prednisone. He in the interim had developed reported purulent otitis media bilaterally. Cultures were sent from right ear canal which grew MSSA. He was placed on augmentin and prednisone and referred to an allergist who reportedly deduced that patient "did not have allergies to anything."  Note I do  Not have records of the patients testing for allergens and do not know if he was on systemic steroids while such tests were done.  Regardless he had some transient reduction in symptoms of ear fullness and drainage. His foot lesions improved slightly but persisted.   He was given keflex which he completed and had similar efficacy. He had some chest discomfort and was sent to Dr. Jacinto Halim who referred him to [place] for workup for what was thought to be recurrent otitis media foot lesions  He is still c/o drainage of the right ear. He had lesions on his feet that are pruritic and at times drain bloody and clear and other times purulent material. They are on his plantar aspects between toes and along sides and top of the feet. Not known to be a diabetic. No AI hx.  He recently slipped in the shower in past few days and believes he ractured a rib on the right side.   I did workup for vasculitides initially and suggested he be seen by Dermatology and that he begin zyrtec but he did not do either. I gave him course of keflex but then as soon as he stopped it his right ear began draining though he also recalls some environmental exposure and has other ssx of allergies with cough congestion.    His feet have improved with topical antibiotic and the keflex but do not at ALL have appearance c/w with an infection that I have seen.     Past Medical History  Diagnosis Date  . Asthma   . COPD (chronic obstructive pulmonary disease) ([facility])   . Recurrent otitis media 05/13/2015  . Foot lesion 05/13/2015  . Rib pain 05/13/2015  . Seasonal allergies 05/13/2015    Past Surgical History  Procedure Laterality Date  . Hernia repair    . Left arm fx with surgical repair  2000    No family history on file.    Social History   Social History  . Marital Status: Single    Spouse Name: N/A  . Number of Children: N/A  . Years of Education: N/A   Social History Main Topics  . Smoking status: Heavy Tobacco Smoker -- 1.50 packs/day    Types: Cigarettes    Start date: 02/23/1977  . Smokeless tobacco: Never Used     Comment: wants to quit smoking  . Alcohol Use: 0.0 oz/week    0 Standard drinks or equivalent per week     Comment: socially  . Drug Use: No  . Sexual Activity: Not Asked   Other Topics Concern  . None   Social History Narrative    No Known Allergies   Current outpatient prescriptions:  .  aspirin  81 MG tablet, Take 81 mg by mouth daily., Disp: , Rfl:  .  cephALEXin (KEFLEX) 500 MG capsule, Take 1 capsule (500 mg total) by mouth 4 (four) times daily., Disp: 56 capsule, Rfl: 2 .  umeclidinium-vilanterol (ANORO ELLIPTA) 62.5-25 MCG/INH AEPB, Inhale 1 puff into the lungs daily., Disp: , Rfl:    Review of Systems  Constitutional: Negative for fever, chills, diaphoresis, activity change, appetite change, fatigue and unexpected weight change.  HENT: Positive for congestion and ear discharge. Negative for rhinorrhea, sinus pressure, sneezing, sore throat and trouble swallowing.   Eyes: Negative for photophobia and visual disturbance.  Respiratory: Negative for cough, chest tightness, shortness of breath, wheezing and stridor.   Cardiovascular: Negative for chest  pain, palpitations and leg swelling.  Gastrointestinal: Negative for nausea, vomiting, abdominal pain, diarrhea, constipation, blood in stool, abdominal distention and anal bleeding.  Genitourinary: Negative for dysuria, hematuria, flank pain and difficulty urinating.  Musculoskeletal: Negative for myalgias, back pain, joint swelling and gait problem.  Skin: Positive for rash. Negative for color change, pallor and wound.  Neurological: Negative for dizziness, tremors, weakness and light-headedness.  Hematological: Negative for adenopathy. Does not bruise/bleed easily.  Psychiatric/Behavioral: Negative for behavioral problems, confusion, sleep disturbance, dysphoric mood, decreased concentration and agitation.       Objective:   Physical Exam  Constitutional: He is oriented to person, place, and time. He appears well-developed and well-nourished.  HENT:  Head: Normocephalic and atraumatic.  Right Ear: Hearing normal. There is drainage.  Left Ear: Hearing normal.  Copious wax in right ear  Eyes: Conjunctivae and EOM are normal.  Neck: Normal range of motion. Neck supple.  Cardiovascular: Normal rate, regular rhythm and normal heart sounds.  Exam reveals no gallop and no friction rub.   No murmur heard. Pulmonary/Chest: Effort normal. No respiratory distress. He has no wheezes.  Abdominal: Soft. He exhibits no distension.  Musculoskeletal: Normal range of motion. He exhibits no edema or tenderness.  Neurological: He is alert and oriented to person, place, and time.  Skin: Skin is warm and dry. Rash noted. No erythema. No pallor.  Psychiatric: He has a normal mood and affect. His behavior is normal. Judgment and thought content normal.   He has lesions that are largely circular and on dorsum and plantar aspect callused in places likely excoriated, also between toes I attempted to take pictures but only one took  05/13/15:      06/17/15:    Right foot 06/17/15:          Assessment & Plan:   Recurrent ear "infections: and lesion on his feet: I find his symptoms MUCH more compelling for an allergic reaction or manifestation of auto-immune pathology. This symmetric pathology in only the ears and feet bilaterally really does not make much sense for infection.   I think it is more likely allergic reaction particularly since these came on since he moved to [place] from [place] and in the "spring time weather of late January, February"  I think that the allergist LIKELY assessed him on corticosteroids thus compromising valdity of skin tests etc.  I would like him seen by Dermatology and ENt would be another good option for opinion here. If he does not get seen by Derm eventually then I will punch biopsy one ofhis foot lesions. I will continue his keflex for now and give him a rx for ciprodex for his right ear  I HAVE ASKED THAT HE START ZYRTEC DAILY once he is done with his  claritin D  Rib pain: fracture and on opoids from PCP  CXR with atherosclerosis of the aorta, defer to PCP and  Cardiology  COPD: being followed by Pulmonary.  I spent greater than 25  minutes with the patient including greater than 50% of time in face to face counsel of the patient  Re his recurrent ear and foot pathology l AI workup, rib pain and in coordination of his care.

## 2016-12-07 ENCOUNTER — Encounter (HOSPITAL_BASED_OUTPATIENT_CLINIC_OR_DEPARTMENT_OTHER): Payer: Self-pay | Admitting: Emergency Medicine

## 2016-12-07 ENCOUNTER — Emergency Department (HOSPITAL_BASED_OUTPATIENT_CLINIC_OR_DEPARTMENT_OTHER)
Admission: EM | Admit: 2016-12-07 | Discharge: 2016-12-07 | Disposition: A | Discharge status: Home / Self Care | Payer: 59 | Attending: Emergency Medicine | Admitting: Emergency Medicine

## 2016-12-07 DIAGNOSIS — F329 Major depressive disorder, single episode, unspecified: Secondary | ICD-10-CM | POA: Insufficient documentation

## 2016-12-07 DIAGNOSIS — F321 Major depressive disorder, single episode, moderate: Secondary | ICD-10-CM

## 2016-12-07 DIAGNOSIS — F17210 Nicotine dependence, cigarettes, uncomplicated: Secondary | ICD-10-CM | POA: Insufficient documentation

## 2016-12-07 DIAGNOSIS — Z79891 Long term (current) use of opiate analgesic: Secondary | ICD-10-CM | POA: Insufficient documentation

## 2016-12-07 DIAGNOSIS — Z7982 Long term (current) use of aspirin: Secondary | ICD-10-CM | POA: Insufficient documentation

## 2016-12-07 DIAGNOSIS — J449 Chronic obstructive pulmonary disease, unspecified: Secondary | ICD-10-CM | POA: Insufficient documentation

## 2016-12-07 LAB — BASIC METABOLIC PANEL
Anion gap: 7 (ref 5–15)
BUN: 11 mg/dL (ref 6–20)
CHLORIDE: 103 mmol/L (ref 101–111)
CO2: 27 mmol/L (ref 22–32)
CREATININE: 0.8 mg/dL (ref 0.61–1.24)
Calcium: 9.1 mg/dL (ref 8.9–10.3)
GFR calc Af Amer: >60 mL/min (ref >60)
GFR calc non Af Amer: >60 mL/min (ref >60)
GLUCOSE: 108 mg/dL — AB (ref 65–99)
POTASSIUM: 4.7 mmol/L (ref 3.5–5.1)
SODIUM: 137 mmol/L (ref 135–145)

## 2016-12-07 LAB — CBC
HCT: 44.3 % (ref 39.0–52.0)
HEMOGLOBIN: 15.5 g/dL (ref 13.0–17.0)
MCH: 32.5 pg (ref 26.0–34.0)
MCHC: 35 g/dL (ref 30.0–36.0)
MCV: 92.9 fL (ref 78.0–100.0)
PLATELETS: 229 10*3/uL (ref 150–400)
RBC: 4.77 MIL/uL (ref 4.22–5.81)
RDW: 13.3 % (ref 11.5–15.5)
WBC: 6.2 10*3/uL (ref 4.0–10.5)

## 2016-12-07 LAB — TROPONIN I

## 2016-12-07 MED ORDER — CITALOPRAM HYDROBROMIDE 20 MG PO TABS: 20.0000 mg | ORAL_TABLET | Freq: Every day | ORAL | 0 refills | Status: AC

## 2016-12-07 MED ORDER — ZOLPIDEM TARTRATE ER 6.25 MG PO TBCR: 6.2500 mg | EXTENDED_RELEASE_TABLET | Freq: Every evening | ORAL | 0 refills | Status: AC | PRN

## 2016-12-07 MED FILL — ZOLPIDEM TART ER 6.25 MG TA: 6.25 | 10 days supply | Qty: 10 | Fill #0

## 2016-12-07 MED FILL — CITALOPRAM HBR 20 MG TABLET: 20 | 30 days supply | Qty: 30 | Fill #0

## 2016-12-07 NOTE — ED Notes (Signed)
Pt stated he does not have any SI/HI thoughts.

## 2016-12-07 NOTE — ED Provider Notes (Signed)
[facility] Provider Note   CSN: [id number] Arrival date & time: 12/07/16  4696     History   Chief Complaint Chief Complaint  Patient presents with  . Anxiety    HPI Lucas Maldonado is a 61 y.o. male.  HPI A she is a 61 year old male presents the emergency department with increasing stress and tearfulness.  He states she's not sleeping well at night.  He states he continues to breakdown and start crying.  He previously was on antidepressant but is no longer on it.  He states excessive pressure at work.  He recently relocated to [place] 3 years ago from [place].  His only daughter is in [place] preparing to have a baby.  He is worried about that as well.  He occasionally drinks alcohol.  He smokes cigarettes.  Denies drug use.  He denies excessive alcohol use.  No homicidal or suicidal thoughts.  No hallucinations.  He states he began feeling like he is having a panic attack today and had chest tightness and palpitations. No prior history of cardiac disease.  His tingling in his fingertips.     Past Medical History:  Diagnosis Date  . Asthma   . COPD (chronic obstructive pulmonary disease) ([facility])   . Foot lesion 05/13/2015  . Fractured rib 06/17/2015  . Recurrent otitis media 05/13/2015  . Rib pain 05/13/2015  . Seasonal allergies 05/13/2015    Patient Active Problem List   Diagnosis Date Noted  . Fractured rib 06/17/2015  . Recurrent otitis media 05/13/2015  . Foot lesion 05/13/2015  . Rib pain 05/13/2015  . Seasonal allergies 05/13/2015  . Unstable angina ([facility]) 04/26/2015  . Chronic obstructive pulmonary disease ([facility]) 12/03/2014  . Cigarette smoker 12/03/2014    Past Surgical History:  Procedure Laterality Date  . HERNIA REPAIR    . Left arm fx with surgical repair  2000       Home Medications    Prior to Admission medications   Medication Sig Start Date End Date Taking? Authorizing Provider  aspirin 81 MG tablet Take 81 mg by mouth  daily.    [provider]  citalopram (CELEXA) 20 MG tablet Take 1 tablet (20 mg total) by mouth daily. 12/07/16   Azalia Bilis, MD  Tiotropium Bromide-Olodaterol (STIOLTO RESPIMAT) 2.5-2.5 MCG/ACT AERS Inhale into the lungs. 02/08/15   [provider]  umeclidinium-vilanterol (ANORO ELLIPTA) 62.5-25 MCG/INH AEPB Inhale 1 puff into the lungs daily.    [provider]  zolpidem (AMBIEN CR) 6.25 MG CR tablet Take 1 tablet (6.25 mg total) by mouth at bedtime as needed for sleep. 12/07/16   Azalia Bilis, MD    Family History No family history on file.  Social History Social History  Substance Use Topics  . Smoking status: Heavy Tobacco Smoker    Packs/day: 1.50    Types: Cigarettes    Start date: 02/23/1977  . Smokeless tobacco: Never Used     Comment: wants to quit smoking  . Alcohol use 0.0 oz/week     Comment: socially     Allergies   Patient has no known allergies.   Review of Systems Review of Systems  All other systems reviewed and are negative.    Physical Exam Updated Vital Signs BP (!) 170/95   Pulse 73   Temp 97.8 F (36.6 C) (Oral)   Resp 16   Ht  (1.93 m)   Wt 86.2 kg (190 lb)   SpO2 97%   BMI  23.13 kg/m   Physical Exam  Constitutional: He is oriented to person, place, and time. He appears well-developed and well-nourished.  HENT:  Head: Normocephalic and atraumatic.  Eyes: EOM are normal.  Neck: Normal range of motion.  Cardiovascular: Normal rate, regular rhythm and normal heart sounds.   Pulmonary/Chest: Effort normal and breath sounds normal. No respiratory distress.  Abdominal: Soft. He exhibits no distension. There is no tenderness.  Musculoskeletal: Normal range of motion.  Neurological: He is alert and oriented to person, place, and time.  Skin: Skin is warm and dry.  Psychiatric:  Tearful.  Anxious.  Depressed.  Nursing note and vitals reviewed.    ED Treatments / Results  Labs (all labs ordered are  listed, but only abnormal results are displayed) Labs Reviewed  BASIC METABOLIC PANEL - Abnormal; Notable for the following:       Result Value   Glucose, Bld 108 (*)    All other components within normal limits  CBC  TROPONIN I    EKG  EKG Interpretation  Date/Time:  Monday December 07 2016 07:36:45 EDT Ventricular Rate:  82 PR Interval:    QRS Duration: 80 QT Interval:  357 QTC Calculation: 417 R Axis:   81 Text Interpretation:  Sinus rhythm No significant change was found Benign early repolarization Confirmed by Azalia Bilis ([id number]) on 12/07/2016 8:00:36 AM       Radiology No results found.  Procedures Procedures (including critical care time)  Medications Ordered in ED Medications - No data to display   Initial Impression / Assessment and Plan / ED Course  I have reviewed the triage vital signs and the nursing notes.  Pertinent labs & imaging results that were available during my care of the patient were reviewed by me and considered in my medical decision making (see chart for details).    Early repolarization pattern consistent with his prior EKGs.  Seems to be more related to depression and anxiety. Doubt ACS.  We'll initiate on antidepressant.  Patient will need outpatient psychotherapy. Primary care follow-up. He understands to return to the ER for new or worsening symptoms.  No homicidal or suicidal thoughts today  Final Clinical Impressions(s) / ED Diagnoses   Final diagnoses:  Current moderate episode of major depressive disorder, unspecified whether recurrent (HCC)    New Prescriptions Discharge Medication List as of 12/07/2016 10:05 AM    START taking these medications   Details  citalopram (CELEXA) 20 MG tablet Take 1 tablet (20 mg total) by mouth daily., Starting Mon 12/07/2016, Print    zolpidem (AMBIEN CR) 6.25 MG CR tablet Take 1 tablet (6.25 mg total) by mouth at bedtime as needed for sleep., Starting Mon 12/07/2016, Print           Azalia Bilis, MD 12/07/16 1032

## 2016-12-07 NOTE — ED Triage Notes (Addendum)
Pt states he is under a lot of stress at work.  Pt feels anxious, is tearful, shaky, sob, chest feels tight, fingertips feel tingly.  Pt feels like he cannot take a deep breath.  No fever, cough, or cold symptoms.  Pt denies SI or HI.  Pt is not taking BP medications at this time.

## 2016-12-07 NOTE — ED Notes (Signed)
Pt verb understanding of d/c instructions 

## 2017-12-05 IMAGING — CR DG CHEST 2V
2 series · 2 of 2 positions shown · non-contrast
Comparison: Chest x-ray 04/26/2015.

CLINICAL DATA: 60-year-old male with history of recurrent
infections.

EXAM:
CHEST  2 VIEW

[w chest pa]
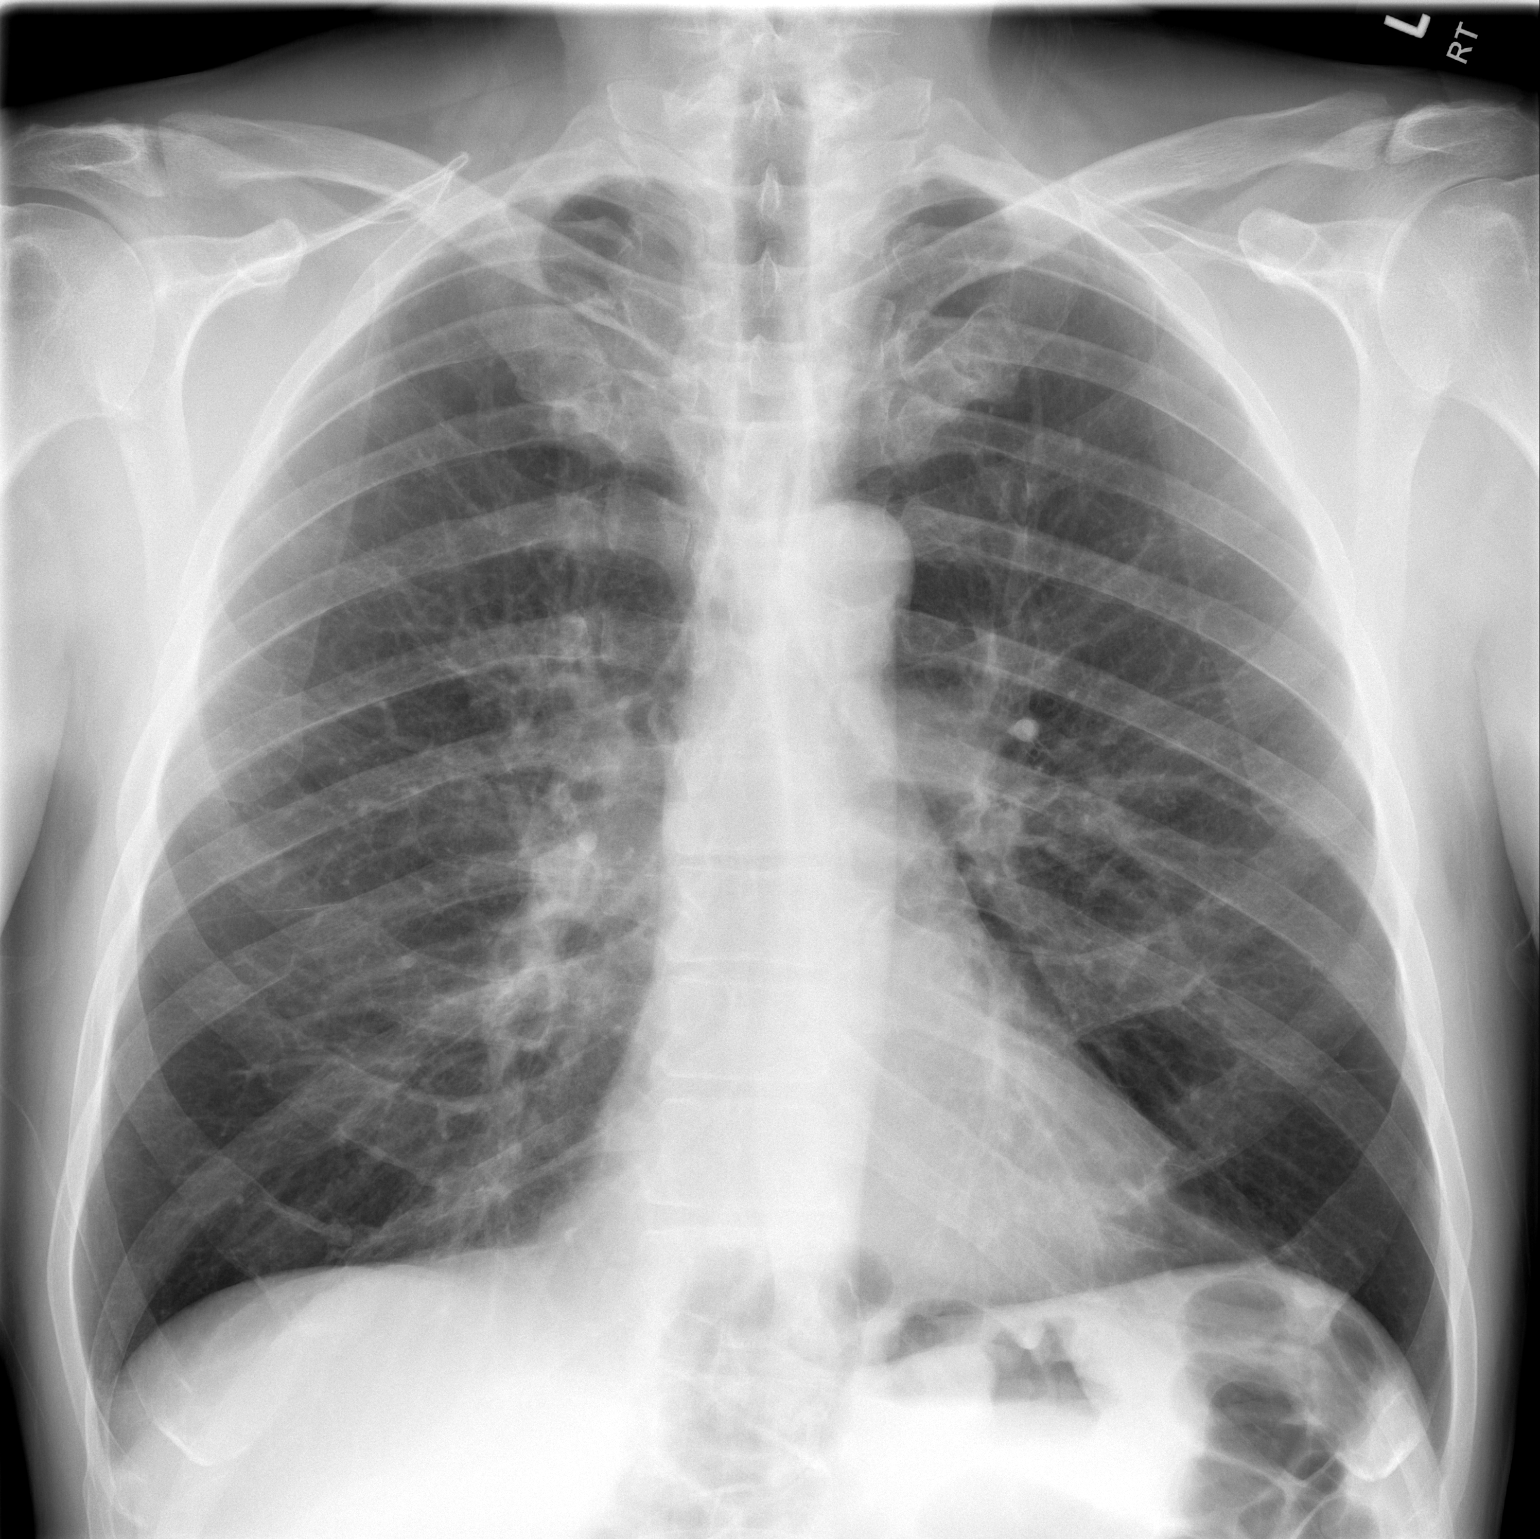

[w chest lat]
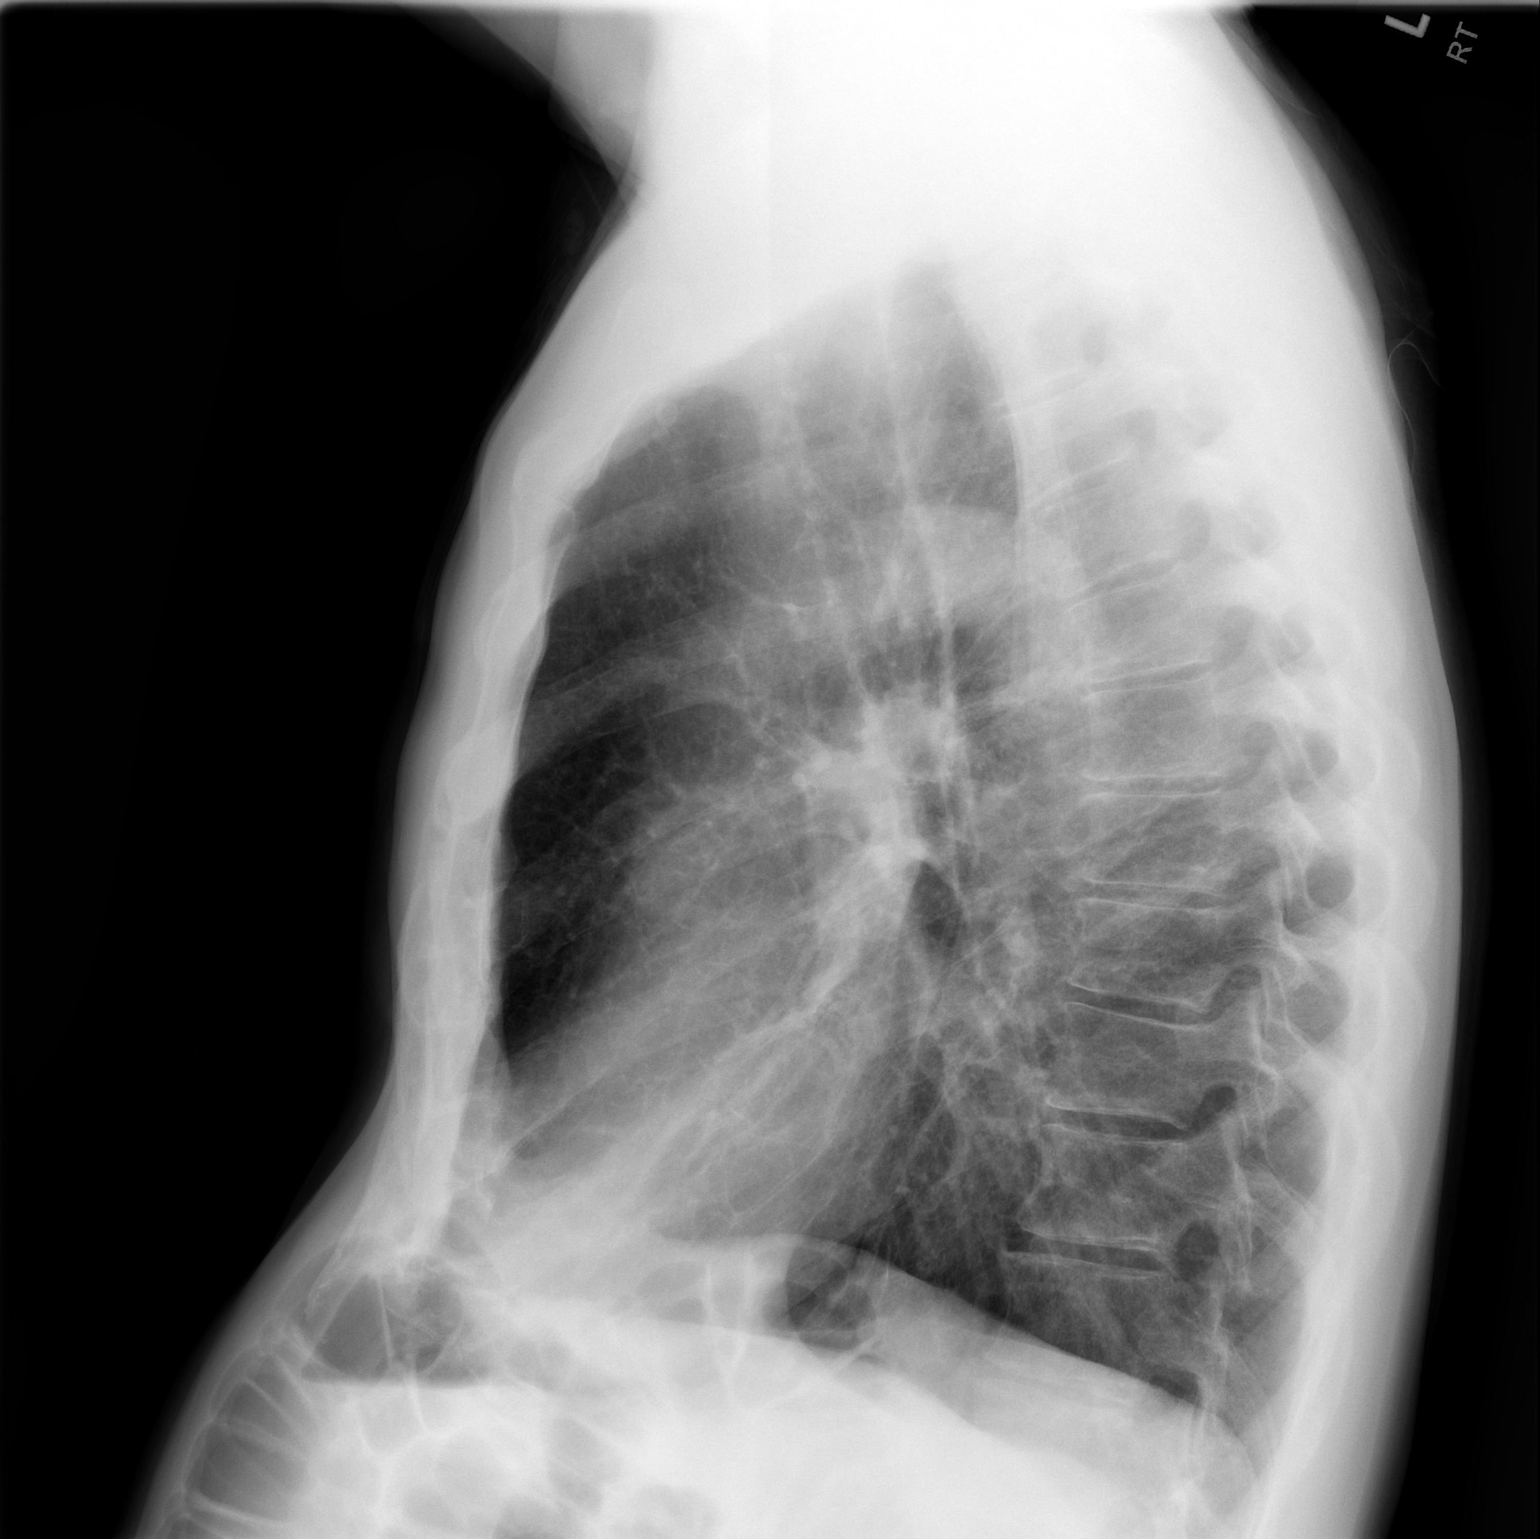

[2 of 2 positions shown; findings below may reference images not displayed]

FINDINGS: Lung volumes are normal. No consolidative airspace disease. No
pleural effusions. No pneumothorax. No pulmonary nodule or mass
noted. Pulmonary vasculature and the cardiomediastinal silhouette
are within normal limits. Atherosclerotic calcifications in the
thoracic aorta.
IMPRESSION: 1. No radiographic evidence of acute cardiopulmonary disease.
2. Atherosclerosis.
